# Patient Record
Sex: Male | Born: 1960 | Hispanic: Yes | Marital: Married | Smoking: Never smoker
Health system: Southern US, Community
[De-identification: ages and names within clinical notes are randomized; demographics above are authoritative.]

---

## 2009-02-23 ENCOUNTER — Encounter: Admission: RE | Admit: 2009-02-23 | Discharge: 2009-02-23 | Payer: Self-pay | Admitting: Family Medicine

## 2010-12-19 IMAGING — CR DG LUMBAR SPINE COMPLETE 4+V
5 series · 5 of 5 positions shown · non-contrast
Comparison: None

CLINICAL DATA: Low left back pain.  No known injury.

LUMBAR SPINE - COMPLETE 4+ VIEW

[view not recorded (1 of 5)]
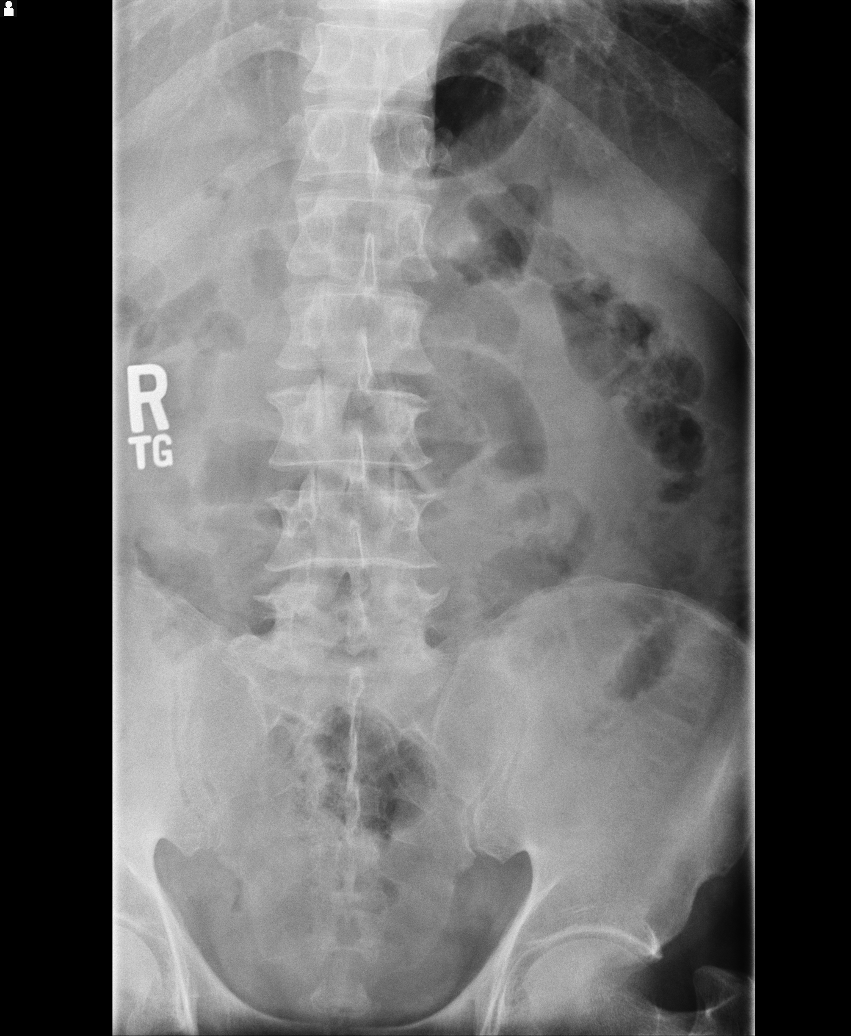

[view not recorded (2 of 5)]
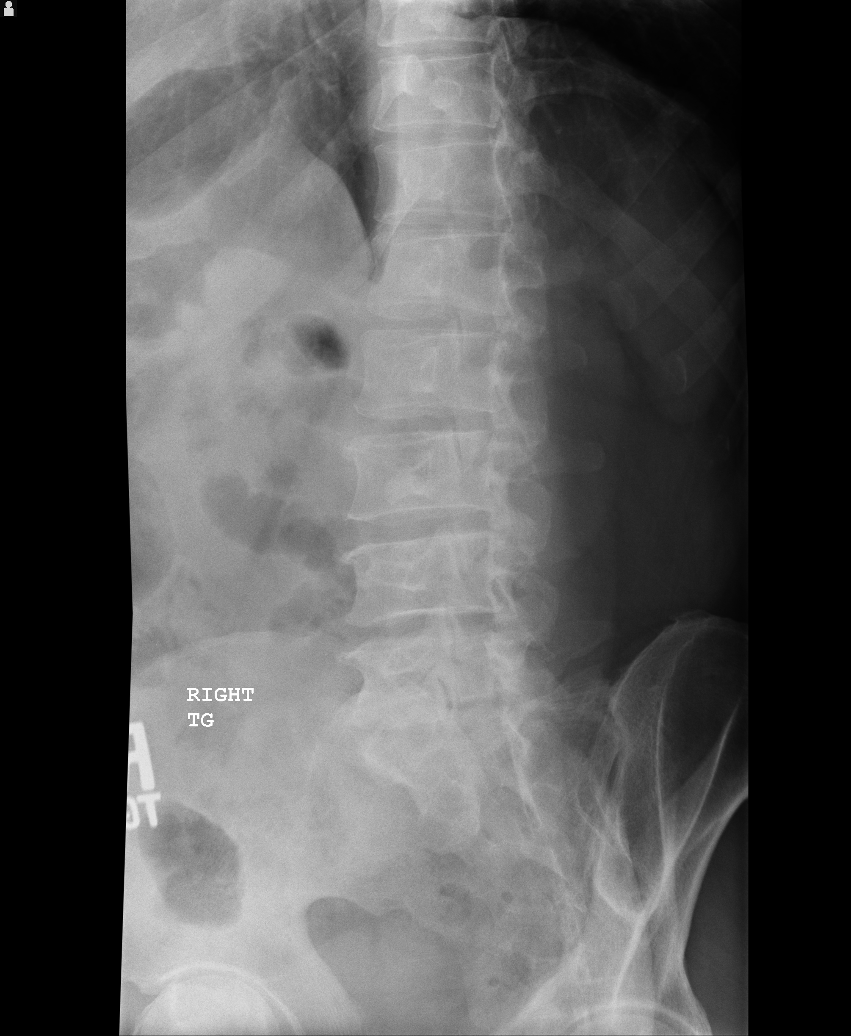

[view not recorded (3 of 5)]
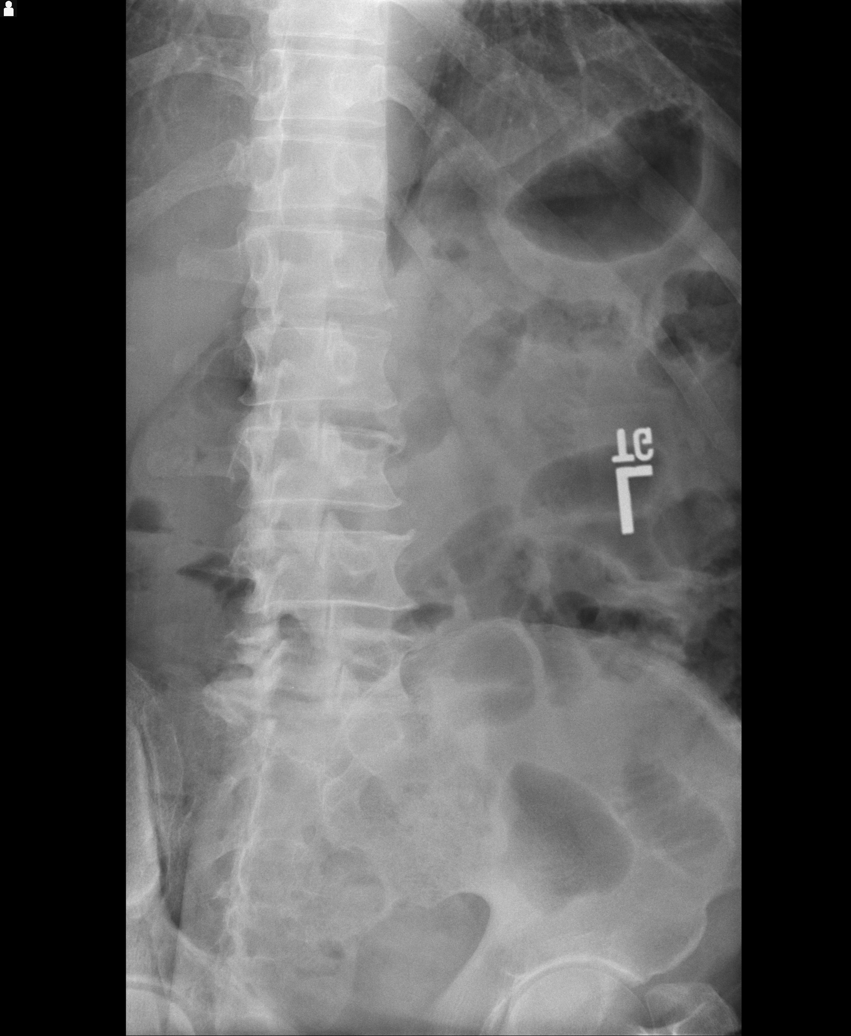

[view not recorded (4 of 5)]
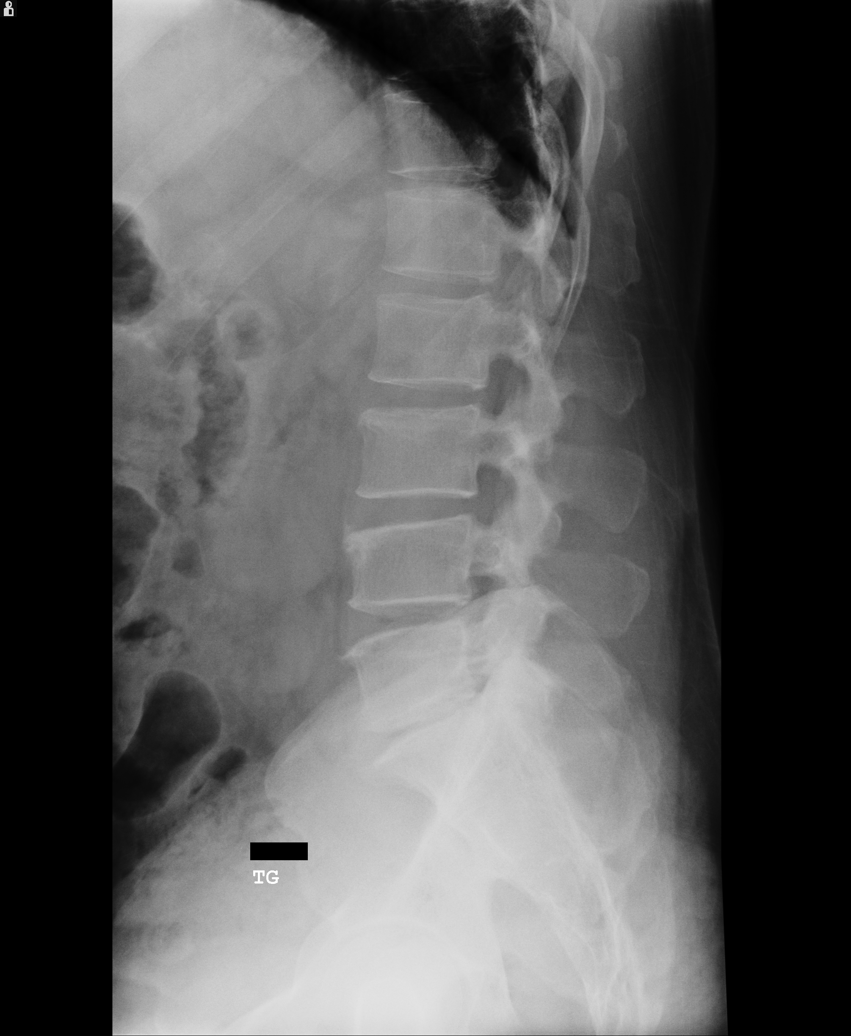

[view not recorded (5 of 5)]
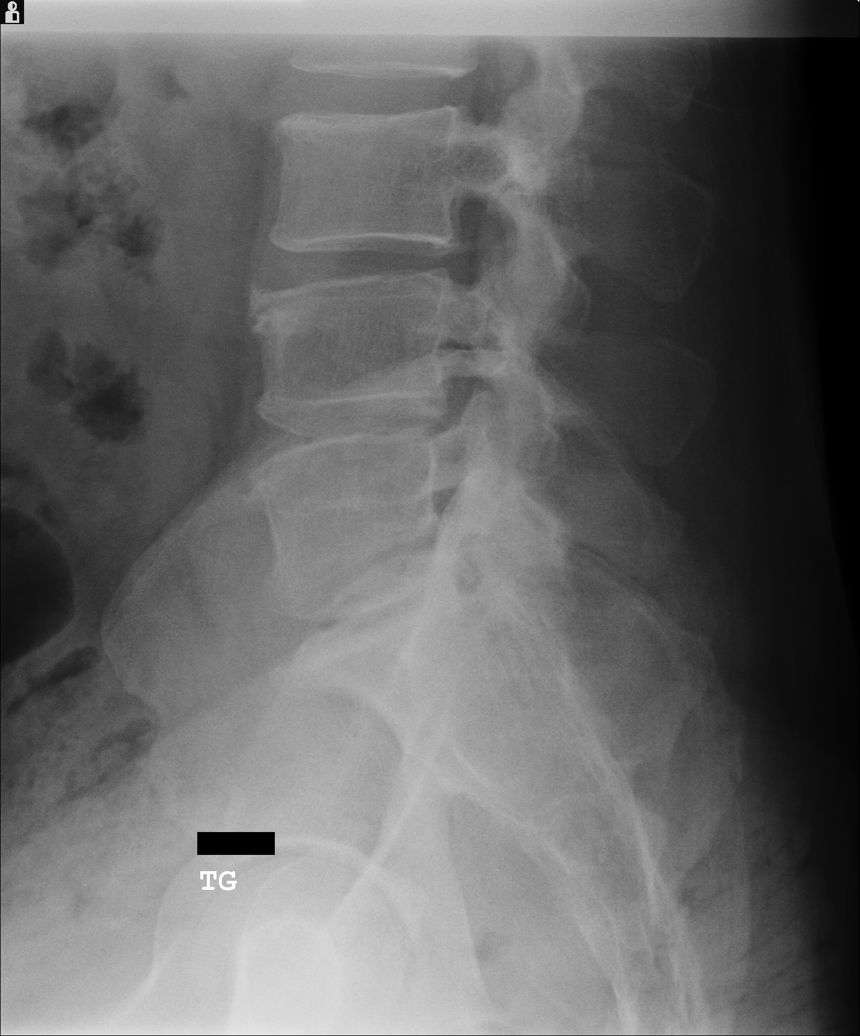

[5 of 5 positions shown; findings below may reference images not displayed]

FINDINGS: There are five lumbar type vertebral bodies.  The
alignment is normal.  There is mild disc space loss at L4-L5 and L5-
S1 with associated paraspinal osteophytes.  Lower lumbar spine
facet degenerative changes are present.  There is no evidence of
fracture or pars defect.
IMPRESSION: Lumbar spondylosis as described with anatomic alignment.  No acute
osseous findings.

## 2015-09-27 ENCOUNTER — Encounter (HOSPITAL_COMMUNITY): Payer: Self-pay | Admitting: *Deleted

## 2015-09-27 ENCOUNTER — Emergency Department (HOSPITAL_COMMUNITY)
Admission: EM | Admit: 2015-09-27 | Discharge: 2015-09-27 | Disposition: A | Discharge status: Home / Self Care | Payer: Medicaid Other | Attending: Dermatology | Admitting: Dermatology

## 2015-09-27 DIAGNOSIS — Z5321 Procedure and treatment not carried out due to patient leaving prior to being seen by health care provider: Secondary | ICD-10-CM | POA: Diagnosis not present

## 2015-09-27 DIAGNOSIS — K469 Unspecified abdominal hernia without obstruction or gangrene: Secondary | ICD-10-CM | POA: Insufficient documentation

## 2015-09-27 NOTE — ED Notes (Signed)
Family at bedside. 

## 2015-09-27 NOTE — ED Notes (Signed)
C/o poss umbilical hernia onset 2 years ago

## 2015-09-27 NOTE — ED Notes (Signed)
Patient called 3X with no response
